# Patient Record
Sex: Female | Born: 1955 | Race: White | Hispanic: No | Marital: Single | Smoking: Never smoker
Health system: Southern US, Community
[De-identification: ages and names within clinical notes are randomized; demographics above are authoritative.]

## PROBLEM LIST (undated history)

## (undated) DIAGNOSIS — I10 Essential (primary) hypertension: Secondary | ICD-10-CM

## (undated) DIAGNOSIS — T783XXA Angioneurotic edema, initial encounter: Secondary | ICD-10-CM

## (undated) HISTORY — PX: EYE SURGERY: SHX253

## (undated) HISTORY — DX: Angioneurotic edema, initial encounter: T78.3XXA

---

## 1998-12-23 ENCOUNTER — Emergency Department (HOSPITAL_COMMUNITY): Admission: EM | Admit: 1998-12-23 | Discharge: 1998-12-23 | Payer: Self-pay

## 2004-01-30 ENCOUNTER — Ambulatory Visit (HOSPITAL_BASED_OUTPATIENT_CLINIC_OR_DEPARTMENT_OTHER): Admission: RE | Admit: 2004-01-30 | Discharge: 2004-01-30 | Payer: Self-pay | Admitting: Plastic Surgery

## 2004-01-30 ENCOUNTER — Ambulatory Visit (HOSPITAL_COMMUNITY): Admission: RE | Admit: 2004-01-30 | Discharge: 2004-01-30 | Payer: Self-pay | Admitting: Plastic Surgery

## 2004-01-30 ENCOUNTER — Encounter (INDEPENDENT_AMBULATORY_CARE_PROVIDER_SITE_OTHER): Payer: Self-pay | Admitting: *Deleted

## 2008-01-02 ENCOUNTER — Other Ambulatory Visit: Admission: RE | Admit: 2008-01-02 | Discharge: 2008-01-02 | Payer: Self-pay | Admitting: Family Medicine

## 2009-10-14 ENCOUNTER — Other Ambulatory Visit: Admission: RE | Admit: 2009-10-14 | Discharge: 2009-10-14 | Payer: Self-pay | Admitting: Family Medicine

## 2010-08-14 NOTE — Op Note (Signed)
NAMEJERUSHA, [name]             ACCOUNT NO.:  [id number]   MEDICAL RECORD NO.:  [id number]          PATIENT TYPE:  AMB   LOCATION:  DSC                          FACILITY:  [facility]   PHYSICIAN:  Mary Contogiannis, M.D.DATE OF BIRTH:  02/24/1956   DATE OF PROCEDURE:  01/30/2004  DATE OF DISCHARGE:                                 OPERATIVE REPORT   PREOPERATIVE DIAGNOSES:  1.  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  2.  Right abdominal suspicious skin lesion.   POSTOPERATIVE DIAGNOSES:  1.  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  2.  Right abdominal suspicious skin lesion.   PROCEDURES:  1.  Excision of 5.0 cm sclerosing basal cell cancer, left breast, with      frozen section diagnosis.  2.  Complex closure of 9.0 cm left breast incision.  3.  Excisional biopsy of 2.5 cm right abdominal suspicious skin lesion.  4.  Intermediate closure of 3.0 cm right abdominal incision.   SURGEON:  Brantley Persons, M.D.   ANESTHESIA:  IV sedation.   ANESTHESIOLOGIST:  Janetta Hora. Gelene Mink, M.D.   ESTIMATED BLOOD LOSS:  5 mL.   FLUIDS REPLACED:  1000 mL crystalloid.   COMPLICATIONS:  None.   INDICATIONS FOR PROCEDURE:  The patient is 55 year old Caucasian female who  has a biopsy proven sclerosing basal cell cancer of the left breast.  It is  rather large and needs to undergo surgical excision. An intraoperative  frozen section diagnosis will be performed to ensure clear surgical margins.  Additionally, the patient has a suspicious skin lesion in the right  abdominal area that she would like to have excised.  She presents for  excision of these lesions.   DESCRIPTION OF PROCEDURE:  The patient was brought into the OR and placed on  the table in the supine position. After adequate IV sedation was obtained,  the area of the excision and dissection for the basal cell cancer of the  left breast along with the right abdominal suspicious skin lesion were  injected in the skin  and subcuticular tissues with 1% lidocaine with  epinephrine.  After adequate hemostasis and anesthesia had taken effect, the  procedure was begun.   Using loupe magnification, the basal cell cancer and healing biopsy site  were identified.  Two mm margins were marked circumferentially around the  skin cancer.  The skin lesion was thus excised full thickness to the skin  into the subcutaneous tissues.  The specimen was marked at the 12 o'clock  position with a suture and sent to the pathologist for an intraoperative  frozen section diagnosis.  While the diagnosis was being obtained, attention  was then turned to the right abdomen.  The chest and abdomen had been  prepped with Betadine and alcohol and draped in sterile fashion.  Using  loupe magnification, the borders of the suspicious skin lesion were  identified.  The patient actually had a satellite skin lesion present  adjacent to this that would be in the line of closure, so this was excised  as well.  The skin lesion was excised full thickness  through the skin into  the subcutaneous tissues.  The specimen was marked at the 12 o'clock  position with a suture and passed off the table to undergo permanent  pathologic section evaluation.  The skin edges and superficial tissues were  then undermined for easier closure. An intermediate closure was then  performed.  The deeper subcutaneous tissues were closed using 3-0 Monocryl  suture.  The dermal layer was closed using 3-0 Monocryl suture.  The skin  was closed with a 4-0 Monocryl running intracuticular suture.  The incision  was dressed with Benzoin.  The total length of the excised skin lesion was  2.5 cm and the total length of the intermediate closure was 3.0 cm.  At this  point, Dr. Kieth Brightly, the pathologist, called into the OR while I was on  standby.  She felt that all of the basal cell cancer had been removed and  there were clear surgical margins.  I therefore proceeded with  closure of  that incision.  The skin edges and soft tissues were then undermined below  the superficial fascial layer significantly in all directions. This allowed  primary closure of the wound.  The wound was closed in complex fashion.  The  breast fascial layer was closed using 3-0 Monocryl suture. The deeper  subcutaneous tissues and deep dermal layers were closed using 3-0 Monocryl  suture.  The skin was then closed with a 4-0 Monocryl running intracuticular  stitch.  The incision was dressed with Steri-Strips.  There were no  complications.  The patient tolerated the procedure well.  Light gauze and  tape dressings were then placed over these incisions as well.  1/4% Marcaine  plain was injected into both areas of dissection at the end of the case to  provide good postoperative analgesia for the patient.  She was then awakened  from the IV sedation and taken to the recovery room in stable condition.  Both the patient and her mother were given proper postoperative wound care  instructions.  She was then discharged home in the care of her mother in  stable condition.  A follow-up appointment will be tomorrow in the office.       MC/MEDQ  D:  01/30/2004  T:  01/30/2004  Job:  [id number]

## 2012-06-20 ENCOUNTER — Emergency Department (HOSPITAL_BASED_OUTPATIENT_CLINIC_OR_DEPARTMENT_OTHER): Payer: Managed Care, Other (non HMO)

## 2012-06-20 ENCOUNTER — Emergency Department (HOSPITAL_BASED_OUTPATIENT_CLINIC_OR_DEPARTMENT_OTHER)
Admission: EM | Admit: 2012-06-20 | Discharge: 2012-06-20 | Disposition: A | Discharge status: Home / Self Care | Payer: Managed Care, Other (non HMO) | Attending: Emergency Medicine | Admitting: Emergency Medicine

## 2012-06-20 ENCOUNTER — Encounter (HOSPITAL_BASED_OUTPATIENT_CLINIC_OR_DEPARTMENT_OTHER): Payer: Self-pay | Admitting: Emergency Medicine

## 2012-06-20 DIAGNOSIS — R112 Nausea with vomiting, unspecified: Secondary | ICD-10-CM

## 2012-06-20 DIAGNOSIS — N390 Urinary tract infection, site not specified: Secondary | ICD-10-CM | POA: Insufficient documentation

## 2012-06-20 DIAGNOSIS — K2970 Gastritis, unspecified, without bleeding: Secondary | ICD-10-CM | POA: Insufficient documentation

## 2012-06-20 DIAGNOSIS — Z79899 Other long term (current) drug therapy: Secondary | ICD-10-CM | POA: Insufficient documentation

## 2012-06-20 DIAGNOSIS — K2990 Gastroduodenitis, unspecified, without bleeding: Secondary | ICD-10-CM | POA: Insufficient documentation

## 2012-06-20 DIAGNOSIS — R197 Diarrhea, unspecified: Secondary | ICD-10-CM | POA: Insufficient documentation

## 2012-06-20 DIAGNOSIS — I10 Essential (primary) hypertension: Secondary | ICD-10-CM | POA: Insufficient documentation

## 2012-06-20 HISTORY — DX: Essential (primary) hypertension: I10

## 2012-06-20 LAB — CBC WITH DIFFERENTIAL/PLATELET
Basophils Relative: 0 % (ref 0–1)
Eosinophils Absolute: 0.2 10*3/uL (ref 0.0–0.7)
Eosinophils Relative: 1 % (ref 0–5)
Lymphs Abs: 3.4 10*3/uL (ref 0.7–4.0)
MCH: 33.1 pg (ref 26.0–34.0)
MCHC: 34.1 g/dL (ref 30.0–36.0)
MCV: 97.1 fL (ref 78.0–100.0)
Monocytes Relative: 8 % (ref 3–12)
Platelets: 296 10*3/uL (ref 150–400)
RBC: 4.44 MIL/uL (ref 3.87–5.11)

## 2012-06-20 LAB — URINALYSIS, ROUTINE W REFLEX MICROSCOPIC
Bilirubin Urine: NEGATIVE
Glucose, UA: NEGATIVE mg/dL
Ketones, ur: NEGATIVE mg/dL
Leukocytes, UA: NEGATIVE
Protein, ur: NEGATIVE mg/dL
pH: 5 (ref 5.0–8.0)

## 2012-06-20 LAB — COMPREHENSIVE METABOLIC PANEL
BUN: 14 mg/dL (ref 6–23)
Calcium: 9.3 mg/dL (ref 8.4–10.5)
GFR calc Af Amer: 83 mL/min — ABNORMAL LOW (ref >90)
Glucose, Bld: 117 mg/dL — ABNORMAL HIGH (ref 70–99)
Total Protein: 7.4 g/dL (ref 6.0–8.3)

## 2012-06-20 LAB — URINE MICROSCOPIC-ADD ON

## 2012-06-20 MED ORDER — GI COCKTAIL ~~LOC~~
30.0000 mL | Freq: Once | ORAL | Status: AC
  Administered 2012-06-20: 30 mL via ORAL
  Filled 2012-06-20 (×2): qty 30

## 2012-06-20 MED ORDER — DICYCLOMINE HCL 10 MG/ML IM SOLN
20.0000 mg | Freq: Once | INTRAMUSCULAR | Status: AC
  Administered 2012-06-20: 20 mg via INTRAMUSCULAR
  Filled 2012-06-20: qty 2

## 2012-06-20 MED ORDER — NITROFURANTOIN MONOHYD MACRO 100 MG PO CAPS: 100.0000 mg | ORAL_CAPSULE | Freq: Two times a day (BID) | ORAL | Status: DC

## 2012-06-20 MED ORDER — SODIUM CHLORIDE 0.9 % IV BOLUS (SEPSIS)
1000.0000 mL | Freq: Once | INTRAVENOUS | Status: AC
  Administered 2012-06-20: 1000 mL via INTRAVENOUS

## 2012-06-20 MED ORDER — ONDANSETRON HCL 4 MG/2ML IJ SOLN
4.0000 mg | Freq: Once | INTRAMUSCULAR | Status: AC
  Administered 2012-06-20: 4 mg via INTRAVENOUS
  Filled 2012-06-20: qty 2

## 2012-06-20 MED ORDER — SUCRALFATE 1 GM/10ML PO SUSP: 1.0000 g | Freq: Four times a day (QID) | ORAL | Status: DC

## 2012-06-20 MED ORDER — ONDANSETRON 8 MG PO TBDP: ORAL_TABLET | ORAL | Status: DC

## 2012-06-20 NOTE — ED Notes (Signed)
Pt c/o abd pain, dx with gastritis yesterday at PMD. Denies vomiting. Pt had 1 episode of diarrhea PTA.

## 2012-06-20 NOTE — ED Notes (Signed)
Patient transported to X-ray 

## 2012-06-20 NOTE — ED Notes (Signed)
Pt states she started new BP medication x 1 week ago.

## 2012-06-20 NOTE — ED Notes (Signed)
MD at bedside. 

## 2012-06-20 NOTE — ED Notes (Signed)
Pt vomiting in triage 

## 2012-06-20 NOTE — ED Provider Notes (Signed)
History     CSN: [id number]  Arrival date & time 06/20/12  0158   First MD Initiated Contact with Patient 06/20/12 0220      Chief Complaint  Patient presents with  . Abdominal Pain    (Consider location/radiation/quality/duration/timing/severity/associated sxs/prior treatment) Patient is a 57 y.o. female presenting with abdominal pain. The history is provided by the patient. No language interpreter was used.  Abdominal Pain Pain location:  Epigastric Pain quality: cramping   Pain radiates to:  Does not radiate Pain severity:  Moderate Onset quality:  Gradual Timing:  Constant Progression:  Unchanged Context: not alcohol use   Relieved by:  Nothing Worsened by:  Nothing tried Ineffective treatments:  None tried Associated symptoms: diarrhea, nausea and vomiting   Associated symptoms: no chest pain, no dysuria, no fever and no shortness of breath   Diarrhea:    Quality:  Watery   Severity:  Mild   Timing:  Constant   Progression:  Unchanged Nausea:    Severity:  Moderate   Timing:  Constant   Progression:  Unchanged Vomiting:    Quality:  Stomach contents   Severity:  Moderate   Timing:  Constant   Progression:  Unchanged Had epigastric tenderness and was diagnosed with gastritis at PMD yesterday and then and worsening pain despite rx for nexium.  Was seen at [facility] walk in clinic and told it was gastritis and reportedly had normal labs and then began having nausea and vomiting and diarrhea and came in for same.    Past Medical History  Diagnosis Date  . Hypertension     Past Surgical History  Procedure Laterality Date  . Eye surgery      No family history on file.  History  Substance Use Topics  . Smoking status: Never Smoker   . Smokeless tobacco: Not on file  . Alcohol Use: Yes    OB History   Grav Para Term Preterm Abortions TAB SAB Ect Mult Living                  Review of Systems  Constitutional: Negative for fever.  Respiratory: Negative  for shortness of breath.   Cardiovascular: Negative for chest pain.  Gastrointestinal: Positive for nausea, vomiting, abdominal pain and diarrhea.  Genitourinary: Negative for dysuria.  All other systems reviewed and are negative.    Allergies  Augmentin  Home Medications   Current Outpatient Rx  Name  Route  Sig  Dispense  Refill  . escitalopram (LEXAPRO) 20 MG tablet   Oral   Take 20 mg by mouth daily.         Marland Kitchen esomeprazole (NEXIUM) 20 MG capsule   Oral   Take 20 mg by mouth daily before breakfast.         . lisinopril (PRINIVIL,ZESTRIL) 20 MG tablet   Oral   Take 20 mg by mouth daily.           BP 91/57  Pulse 104  Temp(Src) 98.3 F (36.8 C) (Oral)  Resp 22  Ht 5\' 5"  (1.651 m)  Wt 160 lb (72.576 kg)  BMI 26.63 kg/m2  SpO2 100%  Physical Exam  Constitutional: She is oriented to person, place, and time. She appears well-developed and well-nourished. No distress.  HENT:  Head: Normocephalic and atraumatic.  Mouth/Throat: Oropharynx is clear and moist. No oropharyngeal exudate.  Eyes: Conjunctivae are normal. Pupils are equal, round, and reactive to light.  Neck: Normal range of motion. Neck supple.  Cardiovascular:  Normal rate, regular rhythm and intact distal pulses.   Pulmonary/Chest: Effort normal and breath sounds normal. She has no wheezes. She has no rales.  Abdominal: Soft. She exhibits no distension. Bowel sounds are increased. There is tenderness in the epigastric area. There is no rigidity, no rebound, no guarding, no tenderness at McBurney's point and negative Murphy's sign.  Musculoskeletal: Normal range of motion.  Neurological: She is alert and oriented to person, place, and time. She has normal reflexes.  Skin: Skin is warm and dry. She is not diaphoretic.  Psychiatric: Her mood appears anxious.    ED Course  Procedures (including critical care time)  Labs Reviewed  CBC WITH DIFFERENTIAL - Abnormal; Notable for the following:    WBC  13.9 (*)    Neutro Abs 9.2 (*)    Monocytes Absolute 1.2 (*)    All other components within normal limits  COMPREHENSIVE METABOLIC PANEL - Abnormal; Notable for the following:    Glucose, Bld 117 (*)    GFR calc non Af Amer 71 (*)    GFR calc Af Amer 83 (*)    All other components within normal limits  LIPASE, BLOOD   Dg Abd Acute W/chest  06/20/2012  *RADIOLOGY REPORT*  Clinical Data: Abdominal pain.  Nausea.  Vomiting.  ACUTE ABDOMEN SERIES (ABDOMEN 2 VIEW & CHEST 1 VIEW)  Comparison: None.  Findings: No acute cardiopulmonary disease.  Mild basilar atelectasis.  No free air underneath the hemidiaphragms.  Scattered air fluid levels are present within small bowel, without small bowel dilation.  Maximal small bowel diameter is 3 cm. Although nonspecific, this is most commonly associated with enteric infection.  Fluid levels are also present in the colon, supporting enteric infection over obstruction.  There is a paucity of distal bowel gas.  IMPRESSION: Nonobstructive bowel gas pattern with air-fluid levels within small bowel and colon, most commonly associated with enteric infection. Obstruction is considered less likely based on the presence of colonic air fluid levels.   Original Report Authenticated By: Andreas Newport, M.D.      No diagnosis found.    MDM  Gastritis with superimposed cramping for n/v/d.  Will treat for UTI.  Return for intractable vomiting, pain especially that localizes to the right or left lower quadrant, fevers > 101 or any concerns.  Follow up in am with your PMD.  Patient verbalizes understanding and agrees to follow up        Hawthorne Day Smitty Cords, MD 06/20/12 [phone]

## 2012-06-23 ENCOUNTER — Other Ambulatory Visit: Payer: Self-pay | Admitting: Family Medicine

## 2012-06-23 ENCOUNTER — Ambulatory Visit
Admission: RE | Admit: 2012-06-23 | Discharge: 2012-06-23 | Disposition: A | Discharge status: Home / Self Care | Payer: Managed Care, Other (non HMO) | Source: Ambulatory Visit | Attending: Family Medicine | Admitting: Family Medicine

## 2012-06-23 DIAGNOSIS — R109 Unspecified abdominal pain: Secondary | ICD-10-CM

## 2012-11-13 ENCOUNTER — Other Ambulatory Visit: Payer: Self-pay | Admitting: Gastroenterology

## 2012-11-13 DIAGNOSIS — R140 Abdominal distension (gaseous): Secondary | ICD-10-CM

## 2012-11-13 DIAGNOSIS — R1084 Generalized abdominal pain: Secondary | ICD-10-CM

## 2012-11-23 ENCOUNTER — Ambulatory Visit
Admission: RE | Admit: 2012-11-23 | Discharge: 2012-11-23 | Disposition: A | Discharge status: Home / Self Care | Payer: Managed Care, Other (non HMO) | Source: Ambulatory Visit | Attending: Gastroenterology | Admitting: Gastroenterology

## 2012-11-23 DIAGNOSIS — R1084 Generalized abdominal pain: Secondary | ICD-10-CM

## 2012-11-23 DIAGNOSIS — R140 Abdominal distension (gaseous): Secondary | ICD-10-CM

## 2012-11-24 ENCOUNTER — Other Ambulatory Visit: Payer: Self-pay | Admitting: Gastroenterology

## 2012-11-24 DIAGNOSIS — R140 Abdominal distension (gaseous): Secondary | ICD-10-CM

## 2012-11-24 DIAGNOSIS — R109 Unspecified abdominal pain: Secondary | ICD-10-CM

## 2012-12-01 ENCOUNTER — Ambulatory Visit
Admission: RE | Admit: 2012-12-01 | Discharge: 2012-12-01 | Disposition: A | Discharge status: Home / Self Care | Payer: 59 | Source: Ambulatory Visit | Attending: Gastroenterology | Admitting: Gastroenterology

## 2012-12-01 DIAGNOSIS — R140 Abdominal distension (gaseous): Secondary | ICD-10-CM

## 2012-12-01 DIAGNOSIS — R109 Unspecified abdominal pain: Secondary | ICD-10-CM

## 2012-12-01 MED ORDER — IOHEXOL 300 MG/ML  SOLN
100.0000 mL | Freq: Once | INTRAMUSCULAR | Status: AC | PRN
  Administered 2012-12-01: 100 mL via INTRAVENOUS

## 2012-12-05 ENCOUNTER — Other Ambulatory Visit (HOSPITAL_COMMUNITY): Payer: Self-pay | Admitting: Gastroenterology

## 2012-12-05 DIAGNOSIS — R188 Other ascites: Secondary | ICD-10-CM

## 2012-12-13 ENCOUNTER — Other Ambulatory Visit (HOSPITAL_COMMUNITY): Payer: Self-pay | Admitting: Gastroenterology

## 2012-12-13 ENCOUNTER — Ambulatory Visit (HOSPITAL_COMMUNITY)
Admission: RE | Admit: 2012-12-13 | Discharge: 2012-12-13 | Disposition: A | Discharge status: Home / Self Care | Payer: Managed Care, Other (non HMO) | Source: Ambulatory Visit | Attending: Gastroenterology | Admitting: Gastroenterology

## 2012-12-13 DIAGNOSIS — R188 Other ascites: Secondary | ICD-10-CM | POA: Insufficient documentation

## 2013-01-24 ENCOUNTER — Other Ambulatory Visit: Payer: Self-pay | Admitting: Gastroenterology

## 2013-02-06 ENCOUNTER — Other Ambulatory Visit: Payer: Self-pay | Admitting: Gastroenterology

## 2013-02-06 DIAGNOSIS — R109 Unspecified abdominal pain: Secondary | ICD-10-CM

## 2013-02-14 ENCOUNTER — Ambulatory Visit
Admission: RE | Admit: 2013-02-14 | Discharge: 2013-02-14 | Disposition: A | Discharge status: Home / Self Care | Payer: 59 | Source: Ambulatory Visit | Attending: Gastroenterology | Admitting: Gastroenterology

## 2013-02-14 DIAGNOSIS — R109 Unspecified abdominal pain: Secondary | ICD-10-CM

## 2013-02-14 MED ORDER — IOHEXOL 300 MG/ML  SOLN
100.0000 mL | Freq: Once | INTRAMUSCULAR | Status: AC | PRN
  Administered 2013-02-14: 100 mL via INTRAVENOUS

## 2013-03-05 ENCOUNTER — Other Ambulatory Visit: Payer: 59

## 2013-12-11 ENCOUNTER — Other Ambulatory Visit: Payer: Self-pay | Admitting: Family Medicine

## 2013-12-11 ENCOUNTER — Other Ambulatory Visit (HOSPITAL_COMMUNITY)
Admission: RE | Admit: 2013-12-11 | Discharge: 2013-12-11 | Disposition: A | Discharge status: Home / Self Care | Payer: BC Managed Care – PPO | Source: Ambulatory Visit | Attending: Family Medicine | Admitting: Family Medicine

## 2013-12-11 DIAGNOSIS — Z124 Encounter for screening for malignant neoplasm of cervix: Secondary | ICD-10-CM | POA: Insufficient documentation

## 2013-12-11 DIAGNOSIS — Z1151 Encounter for screening for human papillomavirus (HPV): Secondary | ICD-10-CM | POA: Insufficient documentation

## 2013-12-13 LAB — CYTOLOGY - PAP

## 2014-03-25 ENCOUNTER — Ambulatory Visit (HOSPITAL_BASED_OUTPATIENT_CLINIC_OR_DEPARTMENT_OTHER)
Admission: RE | Admit: 2014-03-25 | Discharge: 2014-03-25 | Disposition: A | Discharge status: Home / Self Care | Payer: BC Managed Care – PPO | Source: Ambulatory Visit | Attending: Physician Assistant | Admitting: Physician Assistant

## 2014-03-25 ENCOUNTER — Other Ambulatory Visit (HOSPITAL_BASED_OUTPATIENT_CLINIC_OR_DEPARTMENT_OTHER): Payer: Self-pay | Admitting: Physician Assistant

## 2014-03-25 DIAGNOSIS — R109 Unspecified abdominal pain: Secondary | ICD-10-CM | POA: Diagnosis not present

## 2014-03-25 DIAGNOSIS — J9811 Atelectasis: Secondary | ICD-10-CM | POA: Insufficient documentation

## 2014-03-25 DIAGNOSIS — R1084 Generalized abdominal pain: Secondary | ICD-10-CM

## 2014-03-25 DIAGNOSIS — R319 Hematuria, unspecified: Secondary | ICD-10-CM | POA: Insufficient documentation

## 2014-03-26 ENCOUNTER — Encounter (HOSPITAL_BASED_OUTPATIENT_CLINIC_OR_DEPARTMENT_OTHER): Payer: Self-pay | Admitting: *Deleted

## 2014-03-26 ENCOUNTER — Emergency Department (HOSPITAL_BASED_OUTPATIENT_CLINIC_OR_DEPARTMENT_OTHER)
Admission: EM | Admit: 2014-03-26 | Discharge: 2014-03-26 | Disposition: A | Discharge status: Home / Self Care | Payer: BC Managed Care – PPO | Attending: Emergency Medicine | Admitting: Emergency Medicine

## 2014-03-26 DIAGNOSIS — Z79899 Other long term (current) drug therapy: Secondary | ICD-10-CM | POA: Insufficient documentation

## 2014-03-26 DIAGNOSIS — Z3202 Encounter for pregnancy test, result negative: Secondary | ICD-10-CM | POA: Diagnosis not present

## 2014-03-26 DIAGNOSIS — M545 Low back pain, unspecified: Secondary | ICD-10-CM

## 2014-03-26 DIAGNOSIS — I10 Essential (primary) hypertension: Secondary | ICD-10-CM | POA: Insufficient documentation

## 2014-03-26 LAB — PREGNANCY, URINE: PREG TEST UR: NEGATIVE

## 2014-03-26 LAB — URINALYSIS, ROUTINE W REFLEX MICROSCOPIC
Bilirubin Urine: NEGATIVE
Glucose, UA: NEGATIVE mg/dL
KETONES UR: NEGATIVE mg/dL
LEUKOCYTES UA: NEGATIVE
NITRITE: NEGATIVE
PROTEIN: NEGATIVE mg/dL
SPECIFIC GRAVITY, URINE: 1.004 — AB (ref 1.005–1.030)
UROBILINOGEN UA: 0.2 mg/dL (ref 0.0–1.0)
pH: 6.5 (ref 5.0–8.0)

## 2014-03-26 LAB — URINE MICROSCOPIC-ADD ON

## 2014-03-26 MED ORDER — KETOROLAC TROMETHAMINE 60 MG/2ML IM SOLN
60.0000 mg | Freq: Once | INTRAMUSCULAR | Status: AC
  Administered 2014-03-26: 60 mg via INTRAMUSCULAR
  Filled 2014-03-26: qty 2

## 2014-03-26 MED ORDER — DIAZEPAM 5 MG PO TABS: 5.0000 mg | ORAL_TABLET | Freq: Three times a day (TID) | ORAL | Status: AC | PRN

## 2014-03-26 MED ORDER — NAPROXEN 500 MG PO TABS: 500.0000 mg | ORAL_TABLET | Freq: Two times a day (BID) | ORAL | Status: DC | PRN

## 2014-03-26 MED ORDER — DIAZEPAM 5 MG PO TABS
5.0000 mg | ORAL_TABLET | Freq: Once | ORAL | Status: AC
  Administered 2014-03-26: 5 mg via ORAL
  Filled 2014-03-26: qty 1

## 2014-03-26 NOTE — ED Provider Notes (Signed)
CSN: [id number]     Arrival date & time 03/26/14  1311 History   First MD Initiated Contact with Patient 03/26/14 1501     Chief Complaint  Patient presents with  . Back Pain     (Consider location/radiation/quality/duration/timing/severity/associated sxs/prior Treatment) HPI Comments: 58 year old female with history of obesity, high blood pressure presents with right lower back pain without radiation. Patient's had pain for probably one week initially was more central and now right lower back pain worse with specific positions and movement. Patient's had mild similar the past but different area of the back. Patient has CT scan done recently which is negative for kidney stone or other pathology. Patient denies urinary or bowel changes, active cancer, extremity weakness, IVDU, fevers, immunosuppression or significant trauma.   Patient is a 58 y.o. female presenting with back pain. The history is provided by the patient.  Back Pain Associated symptoms: no abdominal pain, no chest pain, no dysuria, no fever, no headaches, no numbness and no weakness     Past Medical History  Diagnosis Date  . Hypertension    Past Surgical History  Procedure Laterality Date  . Eye surgery     History reviewed. No pertinent family history. History  Substance Use Topics  . Smoking status: Never Smoker   . Smokeless tobacco: Not on file  . Alcohol Use: Yes   OB History    No data available     Review of Systems  Constitutional: Negative for fever and chills.  HENT: Negative for congestion.   Eyes: Negative for visual disturbance.  Respiratory: Negative for shortness of breath.   Cardiovascular: Negative for chest pain.  Gastrointestinal: Negative for vomiting and abdominal pain.  Genitourinary: Negative for dysuria and flank pain.  Musculoskeletal: Positive for back pain. Negative for neck pain and neck stiffness.  Skin: Negative for rash.  Neurological: Negative for weakness,  light-headedness, numbness and headaches.      Allergies  Augmentin and Lisinopril  Home Medications   Prior to Admission medications   Medication Sig Start Date End Date Taking? Authorizing Provider  losartan (COZAAR) 50 MG tablet Take 50 mg by mouth daily.   Yes Historical Provider, MD  diazepam (VALIUM) 5 MG tablet Take 1 tablet (5 mg total) by mouth every 8 (eight) hours as needed for anxiety. 03/26/14   Enid SkeensJoshua M Rokhaya Quinn, MD  escitalopram (LEXAPRO) 20 MG tablet Take 20 mg by mouth daily.    Historical Provider, MD  esomeprazole (NEXIUM) 20 MG capsule Take 20 mg by mouth daily before breakfast.    Historical Provider, MD  naproxen (NAPROSYN) 500 MG tablet Take 1 tablet (500 mg total) by mouth 2 (two) times daily as needed. 03/26/14   Enid SkeensJoshua M Azuree Minish, MD  nitrofurantoin, macrocrystal-monohydrate, (MACROBID) 100 MG capsule Take 1 capsule (100 mg total) by mouth 2 (two) times daily. X 7 days 06/20/12   April K Palumbo-Rasch, MD  ondansetron [facility](ZOFRAN ODT) 8 MG disintegrating tablet 8mg  ODT q8 hours prn nausea 06/20/12   April K Palumbo-Rasch, MD  sucralfate (CARAFATE) 1 GM/10ML suspension Take 10 mLs (1 g total) by mouth 4 (four) times daily. 06/20/12   April K Palumbo-Rasch, MD   BP 181/95 mmHg  Pulse 92  Temp(Src) 98.5 F (36.9 C) (Oral)  Resp 16  Ht 5\' 5"  (1.651 m)  Wt 172 lb (78.019 kg)  BMI 28.62 kg/m2  SpO2 99%  LMP 02/19/2014 Physical Exam  Constitutional: She is oriented to person, place, and time. She appears well-developed and  well-nourished.  HENT:  Head: Normocephalic and atraumatic.  Eyes: Conjunctivae are normal. Right eye exhibits no discharge. Left eye exhibits no discharge.  Neck: Normal range of motion. Neck supple. No tracheal deviation present.  Cardiovascular: Normal rate and regular rhythm.   Pulmonary/Chest: Effort normal and breath sounds normal.  Abdominal: Soft. She exhibits no distension. There is no tenderness. There is no guarding.  Musculoskeletal: She  exhibits tenderness. She exhibits no edema.  Central right gluteus and right SI joint to palpation/specific position. No midline lumbar tenderness.  Neurological: She is alert and oriented to person, place, and time.  5+ strength in LE with f/e at major joints. Sensation to palpation intact in LE. CNs 2-12 grossly intact.  Visual fields intact    Skin: Skin is warm. No rash noted.  Psychiatric: She has a normal mood and affect.  Nursing note and vitals reviewed.   ED Course  Procedures (including critical care time) Labs Review Labs Reviewed  URINALYSIS, ROUTINE W REFLEX MICROSCOPIC - Abnormal; Notable for the following:    Specific Gravity, Urine 1.004 (*)    Hgb urine dipstick SMALL (*)    All other components within normal limits  PREGNANCY, URINE  URINE MICROSCOPIC-ADD ON    Imaging Review Ct Renal Stone Study  03/25/2014   CLINICAL DATA:  Abdominal pain. Initial encounter. RIGHT flank pain with onset of symptoms yesterday. Hematuria.  EXAM: CT ABDOMEN AND PELVIS WITHOUT CONTRAST  TECHNIQUE: Multidetector CT imaging of the abdomen and pelvis was performed following the standard protocol without IV contrast.  COMPARISON:  02/14/2013 CT.  FINDINGS: Musculoskeletal: No aggressive osseous lesions. Lumbar spondylosis. L4 limbus vertebra. LEFT hip osteoarthritis. Bone island in the RIGHT acetabular roof. Mild levoconvex curve of the thoracolumbar spine is probably positional.  Lung Bases: Opacity and in both bases is most compatible with atelectasis. No focal consolidation.  Liver: Unenhanced CT was performed per clinician order. Lack of IV contrast limits sensitivity and specificity, especially for evaluation of abdominal/pelvic solid viscera. 1 cm low-density lesion in the posterior RIGHT hepatic lobe probably represents a cyst or hemangioma.  Spleen:  Normal.  Gallbladder:  Normal.  Common bile duct:  Normal.  Pancreas:  Normal.  Adrenal glands:  Normal bilaterally.  Kidneys: LEFT kidney  appears normal. No calculi. LEFT ureter normal. RIGHT ureter normal. RIGHT kidney also appears normal. No RIGHT renal calculi.  Stomach:  Collapsed.  Small bowel: Large duodenal diverticulum at the junction of the second and third parts of the duodenum, large duodenal diverticulum without complicating features. No bowel obstruction.  Colon: Normal appendix. No inflammatory changes of colon. Colonic diverticulosis with transverse colon diverticulum (image 29 series 2) containing high density material. Other distal diverticula are also present. No diverticulitis.  Pelvic Genitourinary:  Normal.  Peritoneum: No free air.  Vasculature: Mild atherosclerosis.  Body Wall: Normal.  IMPRESSION: 1. No acute abnormality.  Negative for renal calculi. 2. Bilateral basilar atelectasis in the lungs.   Electronically Signed   By: Andreas NewportGeoffrey  Lamke M.D.   On: 03/25/2014 11:30     EKG Interpretation None      MDM   Final diagnoses:  Right-sided low back pain without sciatica   Patient with no red flags and reproducible/positional right back pain. Patient had recent CT scan results reviewed no acute findings. Discussed supportive care, strict reasons to return and close follow-up outpatient. No indication for emergent MRI however patient will follow-up with her primary Dr. discussed physical therapy and outpatient MRI if no improvement  in one week.  Results and differential diagnosis were discussed with the patient/parent/guardian. Close follow up outpatient was discussed, comfortable with the plan.   Medications  ketorolac (TORADOL) injection 60 mg (60 mg Intramuscular Given 03/26/14 1531)  diazepam (VALIUM) tablet 5 mg (5 mg Oral Given 03/26/14 1531)    Filed Vitals:   03/26/14 1323 03/26/14 1542  BP: 181/95 164/85  Pulse: 92   Temp: 98.5 F (36.9 C)   TempSrc: Oral   Resp: 16   Height: 5\' 5"  (1.651 m)   Weight: 172 lb (78.019 kg)   SpO2: 99%     Final diagnoses:  Right-sided low back pain without  sciatica       Enid Skeens, MD 03/26/14 1544

## 2014-03-26 NOTE — ED Notes (Signed)
Seen at PMD yesterday, dx with kidney stone. Given shot of toradol, which helped. Sts hydrocodone didn't help last night. Reports increased pain. Denies urinary symptoms

## 2014-03-26 NOTE — ED Notes (Signed)
Pt c/o lower right back pain x 1 week. Seen by PMD x 1 day ago DX kidney stone , CT scan neg pt cont with pain

## 2014-03-26 NOTE — Discharge Instructions (Signed)
If you were given medicines take as directed.  If you are on coumadin or contraceptives realize their levels and effectiveness is altered by many different medicines.  If you have any reaction (rash, tongues swelling, other) to the medicines stop taking and see a physician.   Have blood pressure rechecked in two days. Please follow up as directed and return to the ER or see a physician for new or worsening symptoms such as leg weakness, incontinence, fever, other. Discuss MRI if your symptoms worsen or not improved in one week.  Thank you. Filed Vitals:   03/26/14 1323  BP: 181/95  Pulse: 92  Temp: 98.5 F (36.9 C)  TempSrc: Oral  Resp: 16  Height: 5\' 5"  (1.651 m)  Weight: 172 lb (78.019 kg)  SpO2: 99%

## 2015-04-04 IMAGING — CT CT RENAL STONE PROTOCOL
2 of 4 series · 16 of 46 positions shown, 18 images · non-contrast
Comparison: 02/14/2013 CT.

CLINICAL DATA: Abdominal pain. Initial encounter. RIGHT flank pain
with onset of symptoms yesterday. Hematuria.

EXAM:
CT ABDOMEN AND PELVIS WITHOUT CONTRAST
TECHNIQUE: Multidetector CT imaging of the abdomen and pelvis was performed
following the standard protocol without IV contrast.

[Series 2: renal stone > 200 lbs 5.0 b31f · axial · 0.70mm/px · z∈[-510,-80]mm · 13 of 94 slices shown, 15 images]
[im 4/94  soft-tissue]
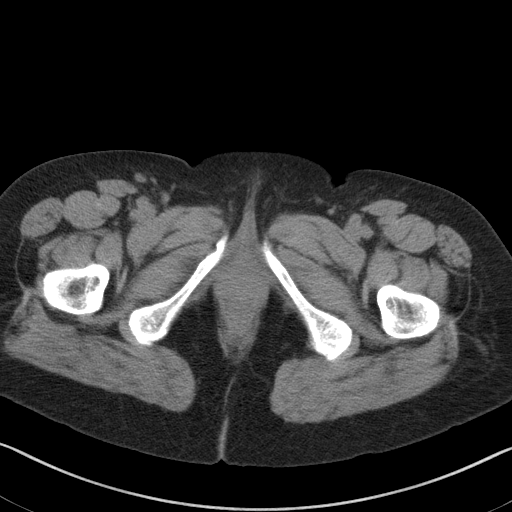
[im 4/94  bone]
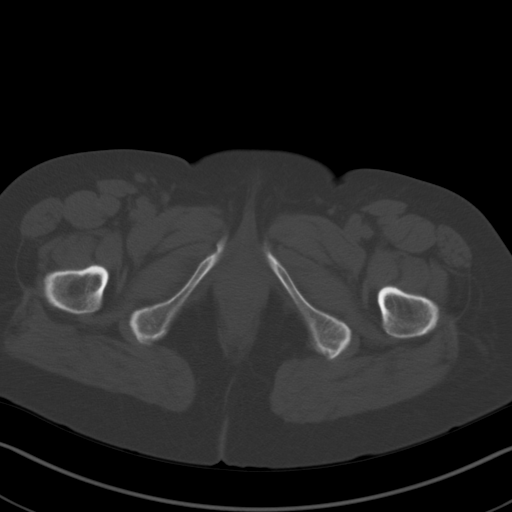
[im 11/94  soft-tissue]
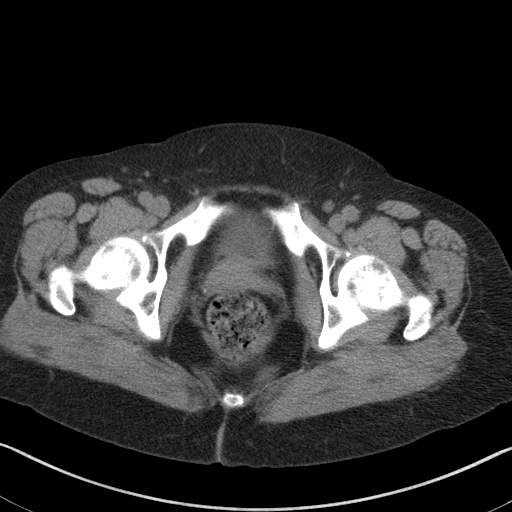
[im 18/94  soft-tissue]
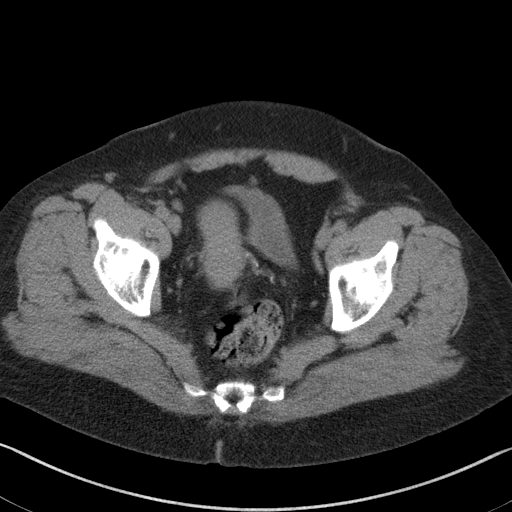
[im 26/94  soft-tissue]
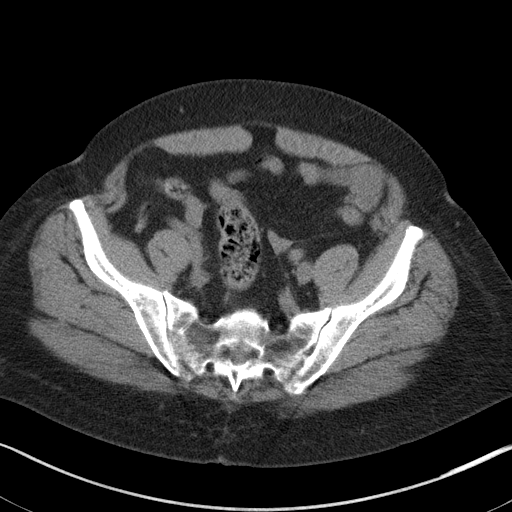
[im 33/94  soft-tissue]
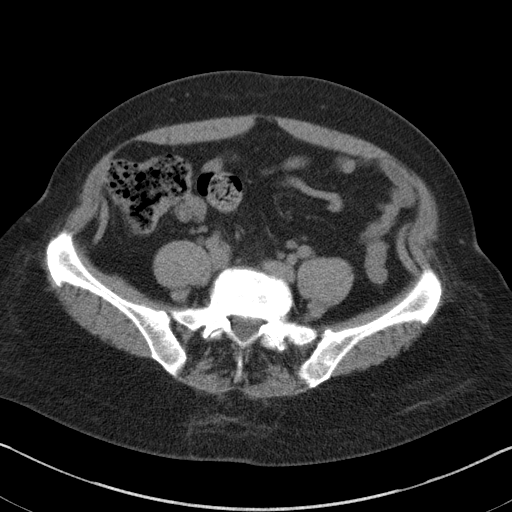
[im 40/94  soft-tissue]
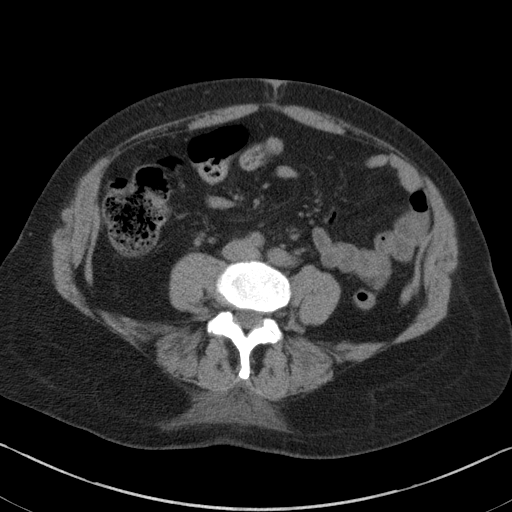
[im 47/94  soft-tissue]
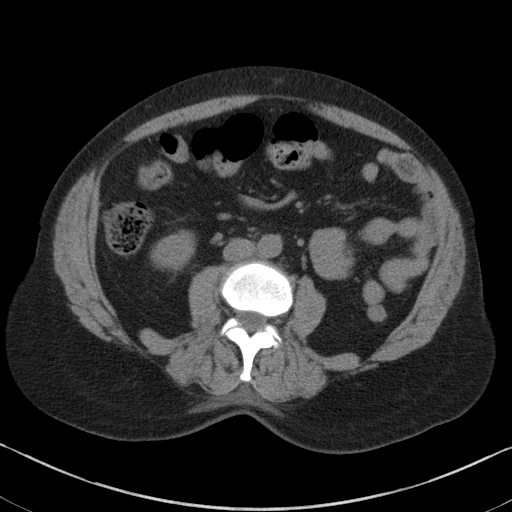
[im 54/94  soft-tissue]
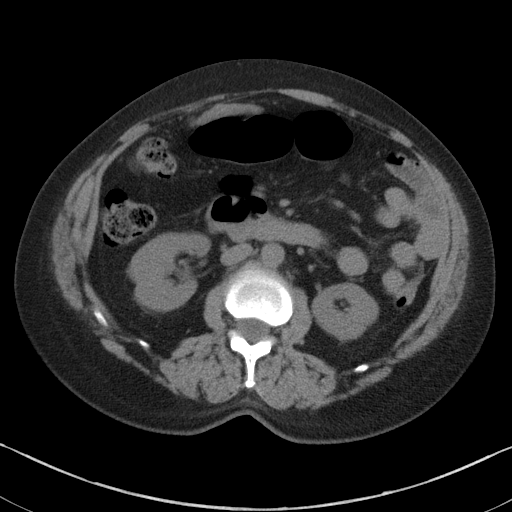
[im 61/94  soft-tissue]
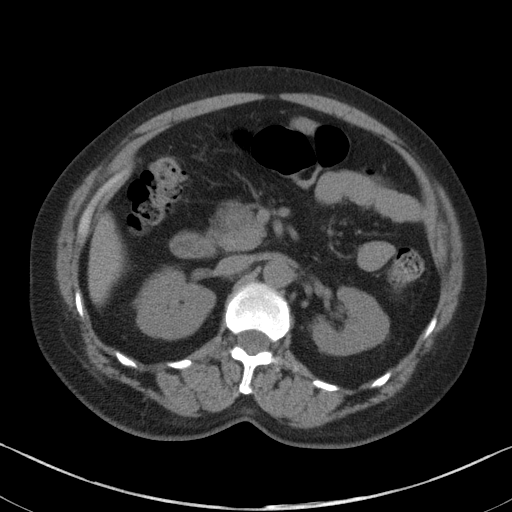
[im 61/94  bone]
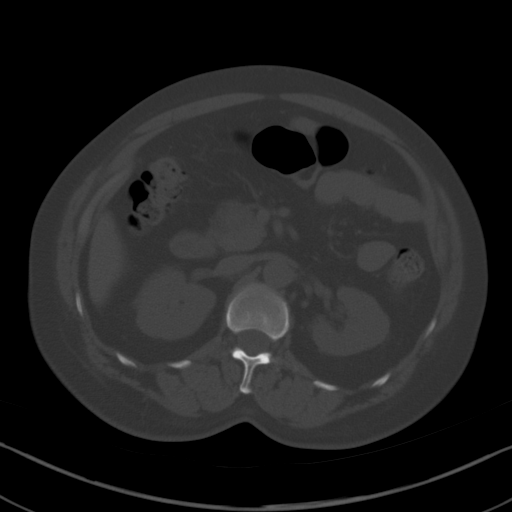
[im 68/94  soft-tissue]
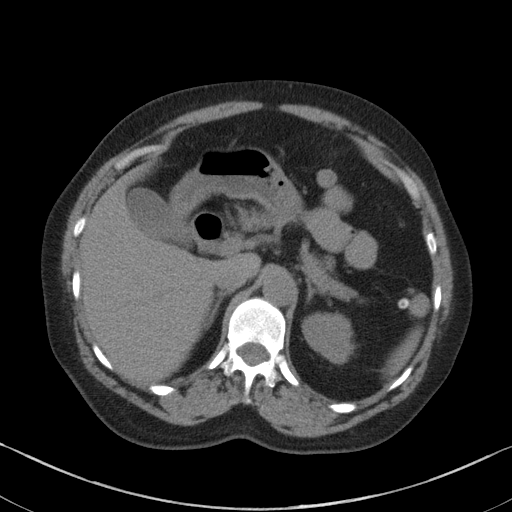
[im 76/94  soft-tissue]
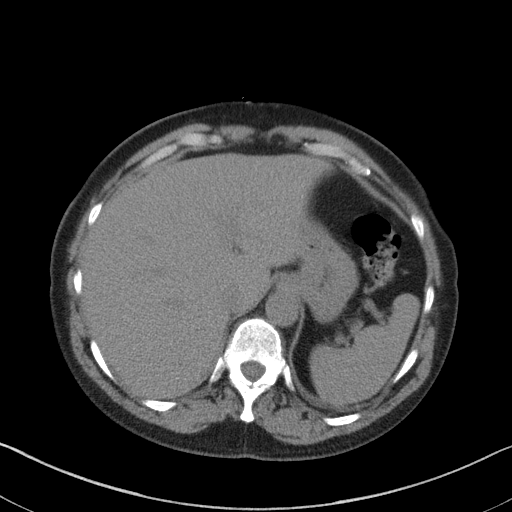
[im 83/94  soft-tissue]
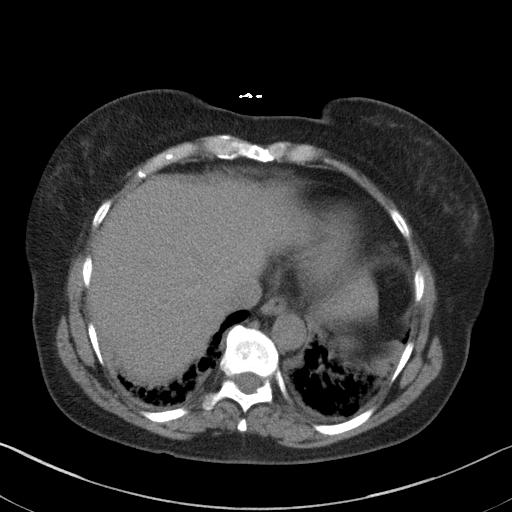
[im 90/94  soft-tissue]
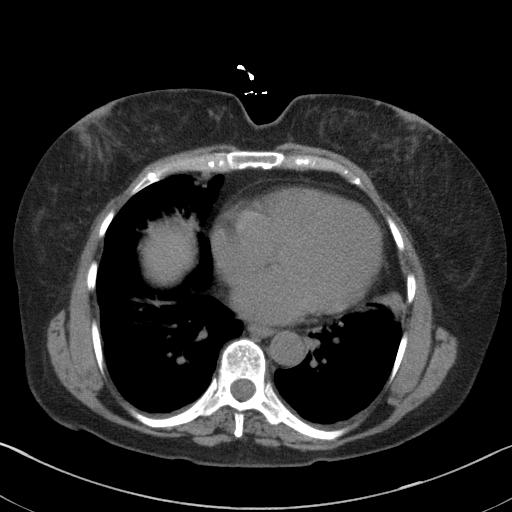

[Series 5: renal stone 3.0 coronal · coronal · 0.72mm/px · 3 of 95 slices shown]
[im 32/95  soft-tissue]
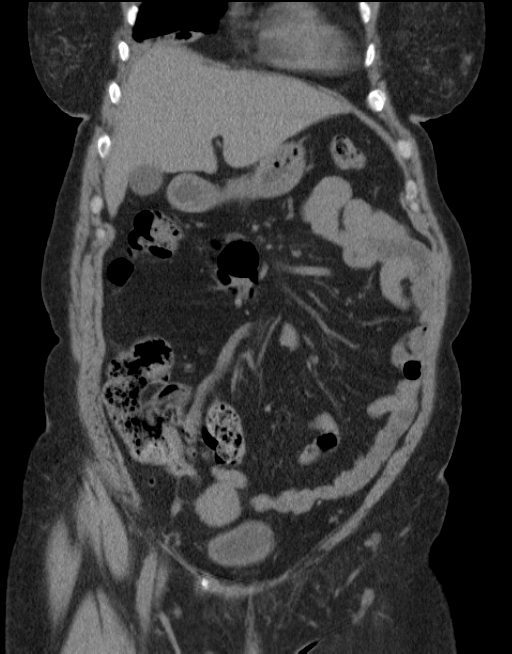
[im 42/95  soft-tissue]
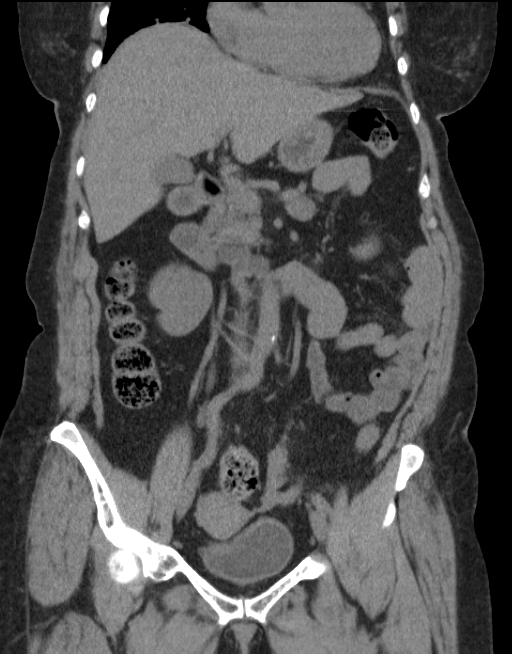
[im 53/95  soft-tissue]
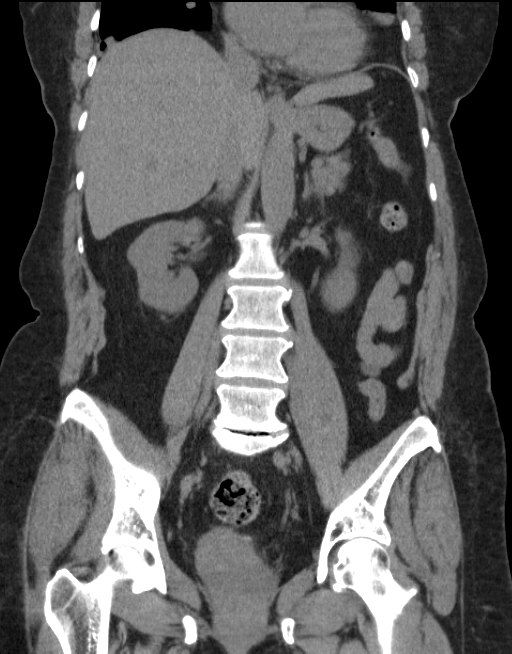

[16 of 46 positions shown; findings below may reference images not displayed]

FINDINGS: Musculoskeletal: No aggressive osseous lesions. Lumbar spondylosis.
L4 limbus vertebra. LEFT hip osteoarthritis. Bone island in the
RIGHT acetabular roof. Mild levoconvex curve of the thoracolumbar
spine is probably positional.

Lung Bases: Opacity and in both bases is most compatible with
atelectasis. No focal consolidation.

Liver: Unenhanced CT was performed per clinician order. Lack of IV
contrast limits sensitivity and specificity, especially for
evaluation of abdominal/pelvic solid viscera. 1 cm low-density
lesion in the posterior RIGHT hepatic lobe probably represents a
cyst or hemangioma.

Spleen:  Normal.

Gallbladder:  Normal.

Common bile duct:  Normal.

Pancreas:  Normal.

Adrenal glands:  Normal bilaterally.

Kidneys: LEFT kidney appears normal. No calculi. LEFT ureter normal.
RIGHT ureter normal. RIGHT kidney also appears normal. No RIGHT
renal calculi.

Stomach:  Collapsed.

Small bowel: Large duodenal diverticulum at the junction of the
second and third parts of the duodenum, large duodenal diverticulum
without complicating features. No bowel obstruction.

Colon: Normal appendix. No inflammatory changes of colon. Colonic
diverticulosis with transverse colon diverticulum (image 29 series
2) containing high density material. Other distal diverticula are
also present. No diverticulitis.

Pelvic Genitourinary:  Normal.

Peritoneum: No free air.

Vasculature: Mild atherosclerosis.

Body Wall: Normal.
IMPRESSION: 1. No acute abnormality.  Negative for renal calculi.
2. Bilateral basilar atelectasis in the lungs.

## 2015-10-03 ENCOUNTER — Other Ambulatory Visit (HOSPITAL_BASED_OUTPATIENT_CLINIC_OR_DEPARTMENT_OTHER): Payer: Self-pay | Admitting: Family Medicine

## 2015-10-03 DIAGNOSIS — Z1231 Encounter for screening mammogram for malignant neoplasm of breast: Secondary | ICD-10-CM

## 2015-10-14 ENCOUNTER — Ambulatory Visit (HOSPITAL_BASED_OUTPATIENT_CLINIC_OR_DEPARTMENT_OTHER): Payer: 59

## 2016-03-23 DIAGNOSIS — H6121 Impacted cerumen, right ear: Secondary | ICD-10-CM | POA: Diagnosis not present

## 2016-04-05 DIAGNOSIS — E7800 Pure hypercholesterolemia, unspecified: Secondary | ICD-10-CM | POA: Diagnosis not present

## 2016-04-05 DIAGNOSIS — Z0001 Encounter for general adult medical examination with abnormal findings: Secondary | ICD-10-CM | POA: Diagnosis not present

## 2016-04-23 DIAGNOSIS — J069 Acute upper respiratory infection, unspecified: Secondary | ICD-10-CM | POA: Diagnosis not present

## 2016-05-29 DIAGNOSIS — Z23 Encounter for immunization: Secondary | ICD-10-CM | POA: Diagnosis not present

## 2017-04-15 DIAGNOSIS — Z8349 Family history of other endocrine, nutritional and metabolic diseases: Secondary | ICD-10-CM | POA: Diagnosis not present

## 2017-04-15 DIAGNOSIS — Z0000 Encounter for general adult medical examination without abnormal findings: Secondary | ICD-10-CM | POA: Diagnosis not present

## 2017-04-15 DIAGNOSIS — I10 Essential (primary) hypertension: Secondary | ICD-10-CM | POA: Diagnosis not present

## 2017-10-28 DIAGNOSIS — I10 Essential (primary) hypertension: Secondary | ICD-10-CM | POA: Diagnosis not present

## 2017-10-28 DIAGNOSIS — Z23 Encounter for immunization: Secondary | ICD-10-CM | POA: Diagnosis not present

## 2017-10-28 DIAGNOSIS — G43909 Migraine, unspecified, not intractable, without status migrainosus: Secondary | ICD-10-CM | POA: Diagnosis not present

## 2017-10-28 DIAGNOSIS — Z6828 Body mass index (BMI) 28.0-28.9, adult: Secondary | ICD-10-CM | POA: Diagnosis not present

## 2017-10-28 DIAGNOSIS — F411 Generalized anxiety disorder: Secondary | ICD-10-CM | POA: Diagnosis not present

## 2018-04-04 DIAGNOSIS — H6123 Impacted cerumen, bilateral: Secondary | ICD-10-CM | POA: Diagnosis not present

## 2018-05-08 DIAGNOSIS — Z800 Family history of malignant neoplasm of digestive organs: Secondary | ICD-10-CM | POA: Diagnosis not present

## 2018-05-08 DIAGNOSIS — K640 First degree hemorrhoids: Secondary | ICD-10-CM | POA: Diagnosis not present

## 2018-05-08 DIAGNOSIS — D122 Benign neoplasm of ascending colon: Secondary | ICD-10-CM | POA: Diagnosis not present

## 2018-05-10 DIAGNOSIS — D122 Benign neoplasm of ascending colon: Secondary | ICD-10-CM | POA: Diagnosis not present

## 2018-08-22 ENCOUNTER — Other Ambulatory Visit (HOSPITAL_COMMUNITY)
Admission: RE | Admit: 2018-08-22 | Discharge: 2018-08-22 | Disposition: A | Discharge status: Home / Self Care | Payer: BLUE CROSS/BLUE SHIELD | Source: Ambulatory Visit | Attending: Family Medicine | Admitting: Family Medicine

## 2018-08-22 ENCOUNTER — Other Ambulatory Visit: Payer: Self-pay | Admitting: Family Medicine

## 2018-08-22 DIAGNOSIS — Z0000 Encounter for general adult medical examination without abnormal findings: Secondary | ICD-10-CM | POA: Diagnosis not present

## 2018-08-22 DIAGNOSIS — G43909 Migraine, unspecified, not intractable, without status migrainosus: Secondary | ICD-10-CM | POA: Diagnosis not present

## 2018-08-22 DIAGNOSIS — E538 Deficiency of other specified B group vitamins: Secondary | ICD-10-CM | POA: Diagnosis not present

## 2018-08-22 DIAGNOSIS — Z1159 Encounter for screening for other viral diseases: Secondary | ICD-10-CM | POA: Diagnosis not present

## 2018-08-22 DIAGNOSIS — Z124 Encounter for screening for malignant neoplasm of cervix: Secondary | ICD-10-CM | POA: Diagnosis not present

## 2018-08-22 DIAGNOSIS — D649 Anemia, unspecified: Secondary | ICD-10-CM | POA: Diagnosis not present

## 2018-08-22 DIAGNOSIS — I10 Essential (primary) hypertension: Secondary | ICD-10-CM | POA: Diagnosis not present

## 2018-08-22 DIAGNOSIS — F411 Generalized anxiety disorder: Secondary | ICD-10-CM | POA: Diagnosis not present

## 2018-08-25 LAB — CYTOLOGY - PAP
Diagnosis: NEGATIVE
HPV: NOT DETECTED

## 2018-10-02 DIAGNOSIS — R197 Diarrhea, unspecified: Secondary | ICD-10-CM | POA: Diagnosis not present

## 2018-10-20 DIAGNOSIS — R300 Dysuria: Secondary | ICD-10-CM | POA: Diagnosis not present

## 2018-12-15 DIAGNOSIS — K635 Polyp of colon: Secondary | ICD-10-CM | POA: Diagnosis not present

## 2018-12-15 DIAGNOSIS — D122 Benign neoplasm of ascending colon: Secondary | ICD-10-CM | POA: Diagnosis not present

## 2018-12-15 DIAGNOSIS — D123 Benign neoplasm of transverse colon: Secondary | ICD-10-CM | POA: Diagnosis not present

## 2019-08-28 DIAGNOSIS — F411 Generalized anxiety disorder: Secondary | ICD-10-CM | POA: Diagnosis not present

## 2019-08-28 DIAGNOSIS — I10 Essential (primary) hypertension: Secondary | ICD-10-CM | POA: Diagnosis not present

## 2019-08-28 DIAGNOSIS — Z0000 Encounter for general adult medical examination without abnormal findings: Secondary | ICD-10-CM | POA: Diagnosis not present

## 2019-08-28 DIAGNOSIS — Z800 Family history of malignant neoplasm of digestive organs: Secondary | ICD-10-CM | POA: Diagnosis not present

## 2019-09-28 DIAGNOSIS — H5713 Ocular pain, bilateral: Secondary | ICD-10-CM | POA: Diagnosis not present

## 2019-09-28 DIAGNOSIS — H04123 Dry eye syndrome of bilateral lacrimal glands: Secondary | ICD-10-CM | POA: Diagnosis not present

## 2019-10-12 DIAGNOSIS — H1013 Acute atopic conjunctivitis, bilateral: Secondary | ICD-10-CM | POA: Diagnosis not present

## 2019-10-31 DIAGNOSIS — H1013 Acute atopic conjunctivitis, bilateral: Secondary | ICD-10-CM | POA: Diagnosis not present

## 2019-11-26 DIAGNOSIS — H18523 Epithelial (juvenile) corneal dystrophy, bilateral: Secondary | ICD-10-CM | POA: Diagnosis not present

## 2021-04-24 NOTE — Progress Notes (Signed)
NEW PATIENT Date of Service/Encounter:  04/27/21 Referring provider: Kathyrn Lass, MD Primary care provider: Kathyrn Lass, MD  Subjective:  [name] is a 66 y.o. female with a PMHx of strabismus presenting today for evaluation of eyelid dermatitis. History obtained from: chart review and patient.   Eye complaints-thinks it was an infection. Initially started during village fair in [place].  Was told she had contact dermatitis from a village fair she attended in [place].  She did not buy any products that she was using on her skin at that event.  Was told she may have touched something at the fair and then rubbed her eyes and that was probably what started it.  She did not buy anything at the fair. The fair was close to September.  Eye symptoms after that, and have lasted up until now.  She has used pataday, systane, lastacaft intermittently over these months to help control symptoms.  Her eyes were dry.  She was using baby shampoo daily and this helped.  Then was using an eye ointment inside her eyes (neomycin and dexamethasone)-helped with swelling.   After this treatment, it kept coming back.  It would come and go for a few days.  She stopped wearing eye makeup during that time.   Was using azelastine that she took for a few weeks.  However, she stopped all meds 3 days prior to today's appointment, and now her symptoms seemed to have completely resolved.  She does have some itchy eye lashes, but otherwise feels symptoms are controlled.  Reviewed pictures on her phone-poor quality but eyelids look slightly edematous and puffy.    Never had allergies prior to this.  Mild seasonal allergies, seem to have gone away as she got older.   Other allergy screening: Asthma: no Food allergy: no Medication allergy:  losartan-swollen lips; Augmentin-caused digestive symptoms Hymenoptera allergy: no Urticaria: no Eczema:no  Past Medical History: Past Medical History:  Diagnosis Date    Angio-edema    Hypertension    Medication List:  Current Outpatient Medications  Medication Sig Dispense Refill   diazepam (VALIUM) 5 MG tablet Take 1 tablet (5 mg total) by mouth every 8 (eight) hours as needed for anxiety. 10 tablet 0   escitalopram (LEXAPRO) 20 MG tablet Take 20 mg by mouth daily.     losartan (COZAAR) 50 MG tablet Take 50 mg by mouth daily.     No current facility-administered medications for this visit.   Known Allergies:  Allergies  Allergen Reactions   Augmentin [Amoxicillin-Pot Clavulanate]    Lisinopril    Past Surgical History: Past Surgical History:  Procedure Laterality Date   EYE SURGERY     Family History: Family History  Problem Relation Age of Onset   Allergic rhinitis Neg Hx    Asthma Neg Hx    Eczema Neg Hx    Urticaria Neg Hx    Social History: [name] lives and a home built in [place], no water damage, carpet floors, gas heating, central AC, 2 cats both outdoor and indoor, no dust mite protection, no smoke exposure, she works as a Research scientist (physical sciences), not exposed to fumes chemicals or dust, no HEPA filter, [place].   ROS:  All other systems negative except as noted per HPI.  Objective:  Blood pressure (!) 148/82, pulse 80, temperature (!) 97.2 F (36.2 C), temperature source Temporal, resp. rate 16, height 5' 4.5" (1.638 m), weight 170 lb 12.8 oz (77.5 kg), SpO2 96 %. Body mass  index is 28.86 kg/m. Physical Exam:  General Appearance:  Alert, cooperative, no distress, appears stated age  Head:  Normocephalic, without obvious abnormality, atraumatic  Eyes:  Conjunctiva clear, EOM's intact  Nose: Nares normal, normal mucosa  Throat: Lips, tongue normal; teeth and gums normal, normal posterior oropharynx  Neck: Supple, symmetrical  Lungs:   clear to auscultation bilaterally, Respirations unlabored, no coughing  Heart:  regular rate and rhythm and no murmur, Appears well perfused  Extremities: No edema  Skin:  Skin color, texture, turgor normal, no rashes or lesions on visualized portions of skin  Neurologic: No gross deficits     Diagnostics: Deferred if she would like to first check insurance cost Assessment and Plan  History of eyelid dermatitis-unclear if infectious/contact dermatitis/allergic conjunctivitis/atopic dermatitis.  Discussed the utility of each type of allergy testing as below.  She would like to first check with her insurance prior to undergoing these.  At this time her symptoms seem to have resolved, and thus further testing may not be necessary.  She will let us know. Patient Instructions  Eyelid Dermatitis:  - if symptoms recur, can use alcaftadine and/or pataday eye drops as well as your Systane drops - try to use minimal amount of products as one of these may be causing your symptoms to prolong - can try home patch testing by putting a small quarter size amount of product on your arm as thick as you would normally apply, cover with bandaid and keep this area dry for the next week, remove bandaid after 48 hours and continue to monitor area for any rashes for the next 2 days; if rash appears this could be causing your symptoms and you should avoid this product  - call your insurance and find out if patch testing (this looks for delayed allergic responses responsible for contact dermatitis)    Code is [id number], will be charged 36 times  - skin testing (skin prick) looks at environmental allergies; this would tell you if you were allergic to things like pollens, dust mites, cat dander, etc.  Code is 95[address], will be charged 59 times  Call and let us know if you decide to move forward with these tests.  For skin testing, must discontinue all antihistamines at least 3 days prior to visit. For patch testing, no oral or injectable steroids for one month prior to testing, no topical steroids for 2 weeks prior to testing.    This note in its entirety was forwarded to the Provider who  requested this consultation.  Thank you for your kind referral. I appreciate the opportunity to take part in [name]'s care. Please do not hesitate to contact me with questions.  Sincerely,  Sigurd Sos, MD [facility]

## 2021-04-27 ENCOUNTER — Other Ambulatory Visit: Payer: Self-pay

## 2021-04-27 ENCOUNTER — Encounter: Payer: Self-pay | Admitting: Internal Medicine

## 2021-04-27 ENCOUNTER — Ambulatory Visit: Payer: No Typology Code available for payment source | Admitting: Internal Medicine

## 2021-04-27 VITALS — BP 148/82 | HR 80 | Temp 97.2°F | Resp 16 | Ht 64.5 in | Wt 170.8 lb

## 2021-04-27 DIAGNOSIS — H01119 Allergic dermatitis of unspecified eye, unspecified eyelid: Secondary | ICD-10-CM | POA: Diagnosis not present

## 2021-04-27 NOTE — Patient Instructions (Addendum)
Eyelid Dermatitis:  - if symptoms recur, can use alcaftadine and/or pataday eye drops as well as your Systane drops - try to use minimal amount of products as one of these may be causing your symptoms to prolong - can try home patch testing by putting a small quarter size amount of product on your arm as thick as you would normally apply, cover with bandaid and keep this area dry for the next week, remove bandaid after 48 hours and continue to monitor area for any rashes for the next 2 days; if rash appears this could be causing your symptoms and you should avoid this product  - call your insurance and find out if patch testing (this looks for delayed allergic responses responsible for contact dermatitis)    Code is 95044, will be charged 36 times  - skin testing (skin prick) looks at environmental allergies; this would tell you if you were allergic to things like pollens, dust mites, cat dander, etc.  Code is 95004, will be charged 59 times  Call and let us know if you decide to move forward with these tests.  For skin testing, must discontinue all antihistamines at least 3 days prior to visit. For patch testing, no oral or injectable steroids for one month prior to testing, no topical steroids for 2 weeks prior to testing.

## 2021-05-25 ENCOUNTER — Ambulatory Visit: Payer: Self-pay | Admitting: Internal Medicine

## 2021-12-03 ENCOUNTER — Other Ambulatory Visit: Payer: Self-pay | Admitting: Family Medicine

## 2021-12-03 ENCOUNTER — Other Ambulatory Visit (HOSPITAL_BASED_OUTPATIENT_CLINIC_OR_DEPARTMENT_OTHER): Payer: Self-pay | Admitting: Family Medicine

## 2021-12-03 DIAGNOSIS — E7800 Pure hypercholesterolemia, unspecified: Secondary | ICD-10-CM

## 2021-12-22 ENCOUNTER — Ambulatory Visit (HOSPITAL_BASED_OUTPATIENT_CLINIC_OR_DEPARTMENT_OTHER)
Admission: RE | Admit: 2021-12-22 | Discharge: 2021-12-22 | Disposition: A | Discharge status: Home / Self Care | Payer: 59 | Source: Ambulatory Visit | Attending: Family Medicine | Admitting: Family Medicine

## 2021-12-22 DIAGNOSIS — E7800 Pure hypercholesterolemia, unspecified: Secondary | ICD-10-CM | POA: Insufficient documentation

## 2023-06-28 ENCOUNTER — Other Ambulatory Visit: Payer: Self-pay | Admitting: Family Medicine

## 2023-06-28 DIAGNOSIS — W19XXXA Unspecified fall, initial encounter: Secondary | ICD-10-CM

## 2024-01-20 ENCOUNTER — Encounter: Payer: Self-pay | Admitting: Sports Medicine

## 2024-01-20 ENCOUNTER — Other Ambulatory Visit (INDEPENDENT_AMBULATORY_CARE_PROVIDER_SITE_OTHER): Payer: Self-pay

## 2024-01-20 ENCOUNTER — Ambulatory Visit: Admitting: Sports Medicine

## 2024-01-20 ENCOUNTER — Other Ambulatory Visit: Payer: Self-pay

## 2024-01-20 DIAGNOSIS — M2141 Flat foot [pes planus] (acquired), right foot: Secondary | ICD-10-CM

## 2024-01-20 DIAGNOSIS — M722 Plantar fascial fibromatosis: Secondary | ICD-10-CM | POA: Diagnosis not present

## 2024-01-20 DIAGNOSIS — M79672 Pain in left foot: Secondary | ICD-10-CM

## 2024-01-20 DIAGNOSIS — M2142 Flat foot [pes planus] (acquired), left foot: Secondary | ICD-10-CM

## 2024-01-20 NOTE — Progress Notes (Signed)
 Patient says that she has had pain in the bottom of the left heel for about 5 weeks now. She purchased a boot to wear at night to keep her foot dorsiflexed, uses ice, does home stretches, and takes Ibuprofen. She says that she has flat feet, and is wondering if she should get insoles for longer term relief. She would like an injection today. Patient walks a lot for work, and this walking does seem to make her pain worse.

## 2024-01-20 NOTE — Progress Notes (Signed)
 [name] - 68 y.o. female MRN [id number]  Date of birth: [birth year]  Office Visit Note: Visit Date: 01/20/2024 PCP: Cleotilde Planas, MD Referred by: Cleotilde Planas, MD  Subjective: Chief Complaint  Patient presents with   Left Heel - Pain   HPI: [name] is a very pleasant 68 y.o. female who presents today for evaluation of chronic left heel pain > 1 month.  [name] has had left heel pain for at least 5 weeks now.  She does have pain with first steps in the morning as well as later in the day with prolonged walking.  She does walk a lot for work which makes her pain worse.  She has tried wearing a night splint to stretch the Achilles/posterior cord.  She is also used ice, ibuprofen and some home stretches.  She has been told she has flatfeet and is inquiring about orthotics.  Pertinent ROS were reviewed with the patient and found to be negative unless otherwise specified above in HPI.   Assessment & Plan: Visit Diagnoses:  1. Plantar fasciitis of left foot   2. Pes planus of both feet   3. Pain of left heel    Plan: Impression is chronic left heel pain with active plantar fasciitis in the setting of bilateral pes planus. She has done a fairly good job at treating this at home with ice, ibuprofen, night splint and stretching but her pain still remains.  She does a lot of walking for work and this is interfering with ADLs.  We discussed all treatment options including therapy, extracorporeal shockwave therapy, injection therapy.  Through shared decision making, we did proceed with ultrasound-guided plantar fascia injection, patient tolerated well. I would like her to rest the remainder of the week/weekend, but then starting on Monday would like her to begin rehab therapy. She is agreeable to home therapy. I did print out a customized handout and did review them in the room with [name] today - will begin Monday and perform once daily consistently. I also gave her some examples of OTC  and custom orthotics she may purchase to help offload the plantar fascia and support her pes planus. I do think the combination of all of these will help her the most. She may use ice or OTC anti-inflammatories as needed for pain. She will see me back or call in 62-month if her pain is not significantly improved.  Follow-up: Return in about 1 month (around 02/20/2024), or if symptoms worsen or fail to improve.   Meds & Orders: No orders of the defined types were placed in this encounter.   Orders Placed This Encounter  Procedures   XR Foot Complete Left   US  Guided Needle Placement - No Linked Charges     Procedures: Foot Inj: left plantar fascia  Date/Time: 01/21/2024 6:55 PM  Performed by: Burnetta Brunet, DO Authorized by: Burnetta Brunet, DO   Consent Given by:  Patient Site marked: the procedure site was marked   Timeout: prior to procedure the correct patient, procedure, and site was verified   Indications:  Fasciitis and pain Condition: Plantar Fasciitis   Location: left plantar fascia muscle   Prep: patient was prepped and draped in usual sterile fashion   Needle Size:  22 G Medications:  3 mL lidocaine  1 %; 2 mL bupivacaine 0.25 %; 6 mg betamethasone acetate-betamethasone sodium phosphate 6 (3-3) MG/ML Patient Tolerance:  Patient tolerated the procedure well with no immediate complications  Procedure: Plantar fasciitis injection, Left After discussion  on R/B/I and informed verbal consent was obtained, a timeout was performed. Patient was lying prone on exam table. Medial aspect of inferior calcaneus was identified with point of maximum tenderness and area was cleaned with betadine and alcohol swab. Then utilizing ultrasound guidance via an in-plane approach, the patient's plantar fasica of left foot was injected with 2:1 bupivicaine:Celestone with multiple needle fenestrations from a medial to lateral in-plane approach. Patient tolerated procedure well without immediate  complications.          Clinical History: No specialty comments available.  She reports that she has never smoked. She does not have any smokeless tobacco history on file. No results for input(s): HGBA1C, LABURIC in the last 8760 hours.  Objective:    Physical Exam  Gen: Well-appearing, in no acute distress; non-toxic CV: Well-perfused. Warm.  Resp: Breathing unlabored on room air; no wheezing. Psych: Fluid speech in conversation; appropriate affect; normal thought process  Ortho Exam - Left foot: + TTP over medial band of plantar fascia and plantar-medial calcaneus.  There is no redness swelling or effusion.  Negative heel squeeze.  There is a mild Achilles contracture of about 90 degrees.  - Bilateral feet: Mild-Moderate pes planus upon standing with loss of longitudinal arch.  Imaging: No results found.  Past Medical/Family/Surgical/Social History: Medications & Allergies reviewed per EMR, new medications updated. There are no active problems to display for this patient.  Past Medical History:  Diagnosis Date   Angio-edema    Hypertension    Family History  Problem Relation Age of Onset   Allergic rhinitis Neg Hx    Asthma Neg Hx    Eczema Neg Hx    Urticaria Neg Hx    Past Surgical History:  Procedure Laterality Date   EYE SURGERY     Social History   Occupational History   Not on file  Tobacco Use   Smoking status: Never   Smokeless tobacco: Not on file  Substance and Sexual Activity   Alcohol use: Yes   Drug use: No   Sexual activity: Yes    Birth control/protection: Pill   Patient was instructed in 10 minutes of therapeutic exercises for left plantar fascia and ankle to improve strength, ROM and function according to my instructions and plan of care during the office visit. A customized handout was provided and demonstration of proper technique shown and discussed. Patient did perform exercises and demonstrate understanding through teachback.   All questions discussed and answered.  Lonell Sprang, DO Primary Care Sports Medicine Physician  [facility]  This note was dictated using Dragon naturally speaking software and may contain errors in syntax, spelling, or content which have not been identified prior to signing this note.

## 2024-01-21 DIAGNOSIS — M722 Plantar fascial fibromatosis: Secondary | ICD-10-CM | POA: Diagnosis not present

## 2024-01-21 DIAGNOSIS — M79672 Pain in left foot: Secondary | ICD-10-CM | POA: Diagnosis not present

## 2024-01-21 MED ORDER — BETAMETHASONE SOD PHOS & ACET 6 (3-3) MG/ML IJ SUSP
6.0000 mg | INTRAMUSCULAR | Status: AC | PRN
  Administered 2024-01-21: 6 mg via INTRA_ARTICULAR

## 2024-01-21 MED ORDER — BUPIVACAINE HCL 0.25 % IJ SOLN
2.0000 mL | INTRAMUSCULAR | Status: AC | PRN
  Administered 2024-01-21: 2 mL

## 2024-01-21 MED ORDER — LIDOCAINE HCL 1 % IJ SOLN
3.0000 mL | INTRAMUSCULAR | Status: AC | PRN
  Administered 2024-01-21: 3 mL

## 2024-01-23 ENCOUNTER — Encounter: Payer: Self-pay | Admitting: Sports Medicine

## 2024-01-30 ENCOUNTER — Encounter: Payer: Self-pay | Admitting: Radiology
